# Patient Record
Sex: Male | Born: 1965 | Race: White | Hispanic: No | Marital: Single | Smoking: Current some day smoker
Health system: Southern US, Community
[De-identification: ages and names within clinical notes are randomized; demographics above are authoritative.]

## PROBLEM LIST (undated history)

## (undated) HISTORY — PX: APPENDECTOMY: SHX54

---

## 2002-09-01 ENCOUNTER — Observation Stay (HOSPITAL_COMMUNITY): Admission: EM | Admit: 2002-09-01 | Discharge: 2002-09-02 | Payer: Self-pay

## 2002-09-01 ENCOUNTER — Encounter: Payer: Self-pay | Admitting: Surgery

## 2011-03-17 ENCOUNTER — Other Ambulatory Visit: Payer: Self-pay

## 2011-03-17 ENCOUNTER — Emergency Department (HOSPITAL_COMMUNITY)
Admission: EM | Admit: 2011-03-17 | Discharge: 2011-03-17 | Disposition: A | Discharge status: Home / Self Care | Payer: 59 | Attending: Emergency Medicine | Admitting: Emergency Medicine

## 2011-03-17 ENCOUNTER — Emergency Department (HOSPITAL_COMMUNITY): Payer: 59

## 2011-03-17 DIAGNOSIS — R42 Dizziness and giddiness: Secondary | ICD-10-CM | POA: Insufficient documentation

## 2011-03-17 DIAGNOSIS — K625 Hemorrhage of anus and rectum: Secondary | ICD-10-CM | POA: Insufficient documentation

## 2011-03-17 DIAGNOSIS — K5730 Diverticulosis of large intestine without perforation or abscess without bleeding: Secondary | ICD-10-CM | POA: Insufficient documentation

## 2011-03-17 DIAGNOSIS — K5790 Diverticulosis of intestine, part unspecified, without perforation or abscess without bleeding: Secondary | ICD-10-CM

## 2011-03-17 LAB — GLUCOSE, CAPILLARY

## 2011-03-17 LAB — COMPREHENSIVE METABOLIC PANEL
ALT: 23 U/L (ref 0–53)
AST: 17 U/L (ref 0–37)
CO2: 21 mEq/L (ref 19–32)
Calcium: 9.6 mg/dL (ref 8.4–10.5)
Chloride: 96 mEq/L (ref 96–112)
GFR calc non Af Amer: >90 mL/min (ref >90)
Sodium: 133 mEq/L — ABNORMAL LOW (ref 135–145)
Total Bilirubin: 0.3 mg/dL (ref 0.3–1.2)

## 2011-03-17 LAB — CBC
Platelets: 270 10*3/uL (ref 150–400)
RBC: 5.08 MIL/uL (ref 4.22–5.81)
WBC: 14.7 10*3/uL — ABNORMAL HIGH (ref 4.0–10.5)

## 2011-03-17 LAB — TYPE AND SCREEN

## 2011-03-17 LAB — PROTIME-INR: Prothrombin Time: 12.6 seconds (ref 11.6–15.2)

## 2011-03-17 MED ORDER — IOHEXOL 300 MG/ML  SOLN
100.0000 mL | Freq: Once | INTRAMUSCULAR | Status: AC | PRN
  Administered 2011-03-17: 100 mL via INTRAVENOUS

## 2011-03-17 MED ORDER — SODIUM CHLORIDE 0.9 % IV SOLN
80.0000 mg | Freq: Once | INTRAVENOUS | Status: AC
  Administered 2011-03-17: 80 mg via INTRAVENOUS
  Filled 2011-03-17: qty 80

## 2011-03-17 MED ORDER — SODIUM CHLORIDE 0.9 % IV SOLN
8.0000 mg/h | INTRAVENOUS | Status: DC
  Administered 2011-03-17: 8 mg/h via INTRAVENOUS
  Filled 2011-03-17 (×2): qty 80

## 2011-03-17 MED ORDER — SODIUM CHLORIDE 0.9 % IV BOLUS (SEPSIS)
1000.0000 mL | Freq: Once | INTRAVENOUS | Status: AC
  Administered 2011-03-17: 1000 mL via INTRAVENOUS

## 2011-03-17 NOTE — ED Notes (Signed)
Pt complains of bright red blood in his stool with clots three times  Since last night, hes sweaty and very nauseated, he states that his vision is blurry in triage

## 2011-03-17 NOTE — ED Provider Notes (Signed)
History     CSN: [id number]  Arrival date & time 03/17/11  1231   First MD Initiated Contact with Patient 03/17/11 1247      Chief Complaint  Patient presents with  . Excessive Sweating  . Nausea  . Rectal Bleeding    (Consider location/radiation/quality/duration/timing/severity/associated sxs/prior treatment) HPI Patient is a 45 roll male who presents today for rectal bleeding. The patient has had 3 episodes of soft stools with the toilet filled with blood. He has had no abdominal pain with this. He did get lightheaded but denies any chest pain or shortness of breath with this. Patient has not had any recent abdominal pain. He was mildly tachycardic on presentation. Patient has no other health problems. He denies any fevers, nausea, or vomiting. There are no other associated or modifying factors. No past medical history on file.  No past surgical history on file.  No family history on file.  History  Substance Use Topics  . Smoking status: Not on file  . Smokeless tobacco: Not on file  . Alcohol Use: Not on file      Review of Systems  Constitutional: Negative.   HENT: Negative.   Eyes: Negative.   Respiratory: Negative.   Cardiovascular: Negative.   Gastrointestinal: Positive for blood in stool.  Genitourinary: Negative.   Musculoskeletal: Negative.   Skin: Negative.   Neurological: Positive for light-headedness.  Hematological: Negative.   Psychiatric/Behavioral: Negative.   All other systems reviewed and are negative.    Allergies  Review of patient's allergies indicates no known allergies.  Home Medications   Current Outpatient Rx  Name Route Sig Dispense Refill  . ASPIRIN EC 81 MG PO TBEC Oral Take 81 mg by mouth daily.        BP 135/83  Pulse 71  Temp(Src) 97.6 F (36.4 C) (Oral)  Resp 20  SpO2 100%  Physical Exam  Nursing note and vitals reviewed. Constitutional: He is oriented to person, place, and time. He appears well-developed and  well-nourished. No distress.  HENT:  Head: Normocephalic and atraumatic.  Eyes: Conjunctivae and EOM are normal. Pupils are equal, round, and reactive to light.  Neck: Normal range of motion.  Cardiovascular: Normal rate, regular rhythm, normal heart sounds and intact distal pulses.  Exam reveals no gallop and no friction rub.   No murmur heard. Pulmonary/Chest: Effort normal and breath sounds normal. No respiratory distress. He has no wheezes. He has no rales.  Abdominal: Soft. Bowel sounds are normal. He exhibits no distension. There is no tenderness. There is no rebound and no guarding.  Genitourinary: Guaiac positive stool.       Good rectal tone, gross blood on the glove.  Musculoskeletal: Normal range of motion.  Neurological: He is alert and oriented to person, place, and time. No cranial nerve deficit. He exhibits normal muscle tone. Coordination normal.  Skin: Skin is warm and dry. No rash noted.  Psychiatric: He has a normal mood and affect.    ED Course  Procedures (including critical care time)  Labs Reviewed  CBC - Abnormal; Notable for the following:    WBC 14.7 (*)    MCHC 36.5 (*)    All other components within normal limits  COMPREHENSIVE METABOLIC PANEL - Abnormal; Notable for the following:    Sodium 133 (*)    Glucose, Bld 134 (*)    All other components within normal limits  GLUCOSE, CAPILLARY - Abnormal; Notable for the following:    Glucose-Capillary 134 (*)  All other components within normal limits  PROTIME-INR  TYPE AND SCREEN  LIPASE, BLOOD  OCCULT BLOOD, POC DEVICE  ABO/RH  POCT OCCULT BLOOD STOOL, DEVICE   Ct Abdomen Pelvis W Contrast  03/17/2011  *RADIOLOGY REPORT*  Clinical Data: 46 year old with rectal bleeding and lightheadedness.  CT ABDOMEN AND PELVIS WITH CONTRAST  Technique:  Multidetector CT imaging of the abdomen and pelvis was performed following the standard protocol during bolus administration of intravenous contrast.  Contrast:  OMNIPAQUE IOHEXOL 300 MG/ML IV SOLN  Comparison: CT report from 09/01/2002.  These images are not available.  Findings: Lung bases are clear.  There is no evidence for free air. Normal appearance of the liver, portal venous system, gallbladder, spleen, stomach and pancreas.  Normal appearance of the adrenal tissue and kidneys.  There is fluid within the urinary bladder without gross abnormality.  No gross abnormality prostate or seminal vesicles.  Bilateral inguinal hernias containing fat. There is a small cyst in the posterior right kidney.  There is oral contrast in the small and large bowel.  There are left-sided colonic diverticula without acute inflammation. There are small lymph nodes in the retroperitoneum around the aorta. Index lymph node between the aorta and inferior vena cava on sequence 2, image 36 measures 1.1 x 0.9 cm.  Lymph node in the left external iliac area roughly measures 0.9 cm in the short axis.  No focal rectal or colonic abnormality.  No acute bony abnormalities.  IMPRESSION: No acute abdominal or pelvic abnormalities.  Colonic diverticulosis without focal inflammation.  Small retroperitoneal lymph nodes which are nonspecific.  Original Report Authenticated By: Richarda Overlie, M.D.     1. Diverticulosis   2. Rectal bleeding       MDM  Patient was evaluated and workup for possible GI bleed was begun. Patient did have rectal exam with gross blood on the glove.  Patient was feeling better by the time he arrived. He was no longer tachycardic on my evaluation though he had been tachycardic at triage. Patient had no abdominal pain. As a precaution he was kept n.p.o. and was started on protonix drip. Hemoglobin was stable. Patient had no change in his vital signs.  Patient did have a CT of the abdomen and pelvis. This showed diverticulosis. I discussed these findings with the patient. I spoke with Dr. Matthias Hughs of gastroenterology. He arranged for the patient to have an appointment tomorrow  with his partner Dr. Evette Cristal.  Patient will be seen tomorrow at 9:45 AM. If he has change in his symptoms including chest pain, turn of lightheadedness, shortness of breath, or abdominal pain he is welcome to return for repeat evaluation. Patient and family were comfortable with plan for discharge home.        Cyndra Numbers, MD 03/17/11 1746

## 2011-03-20 ENCOUNTER — Other Ambulatory Visit: Payer: Self-pay | Admitting: Gastroenterology

## 2012-09-14 IMAGING — CT CT ABD-PELV W/ CM
1 of 3 series · 14 of 32 positions shown, 19 images · IV contrast (APPLIED)
Comparison: CT report from 09/01/2002.  These images are not
available.

CLINICAL DATA: 45-year-old with rectal bleeding and
lightheadedness.

CT ABDOMEN AND PELVIS WITH CONTRAST
TECHNIQUE: Multidetector CT imaging of the abdomen and pelvis was
performed following the standard protocol during bolus
administration of intravenous contrast.
Contrast: 100mL OMNIPAQUE IOHEXOL 300 MG/ML IV SOLN

[Series 2: abd/pel with · axial · 0.97mm/px · z∈[+1608,+2048]mm · 14 of 100 slices shown, 19 images]
[im 6/100  soft-tissue]
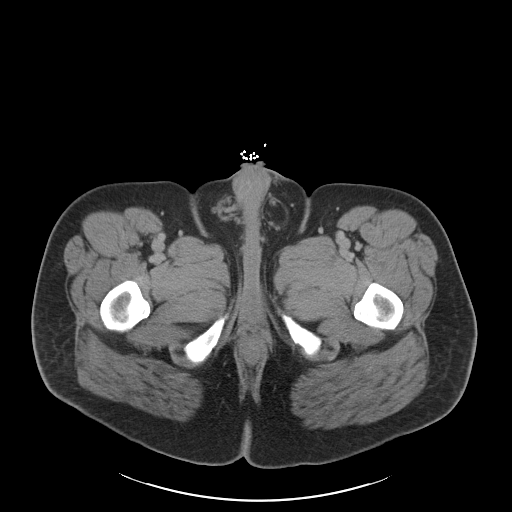
[im 6/100  bone]
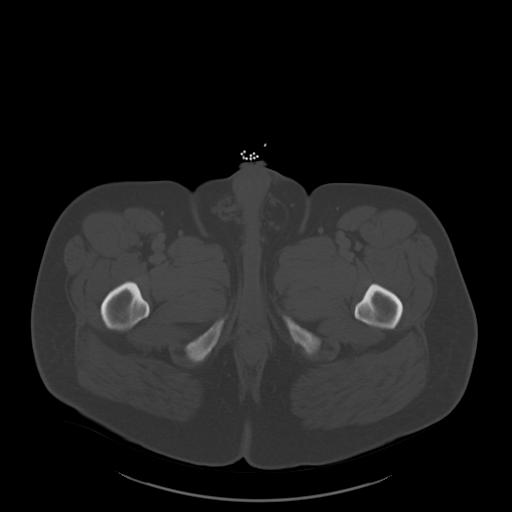
[im 12/100  soft-tissue]
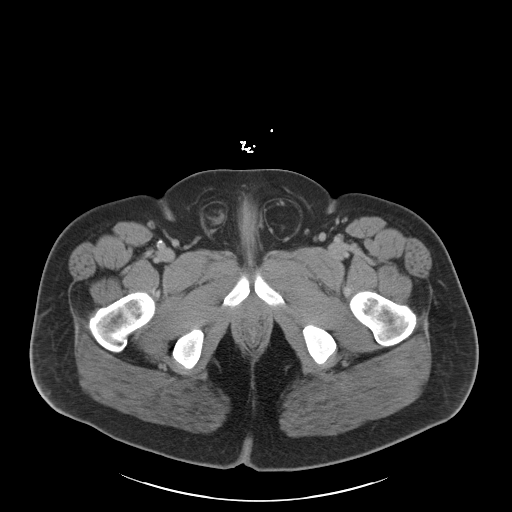
[im 23/100  soft-tissue]
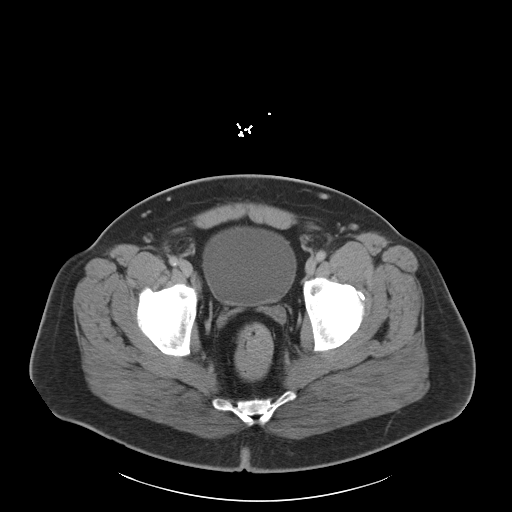
[im 28/100  soft-tissue]
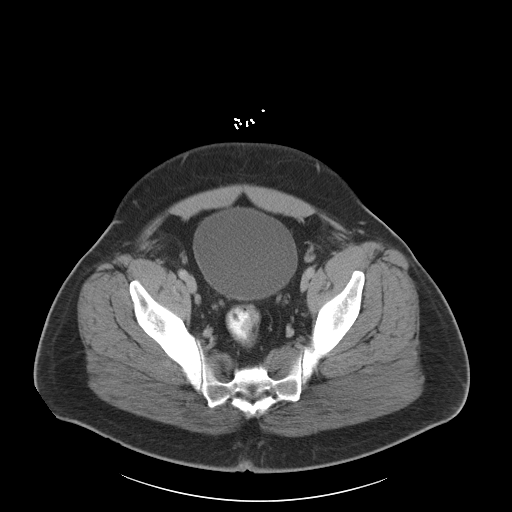
[im 34/100  soft-tissue]
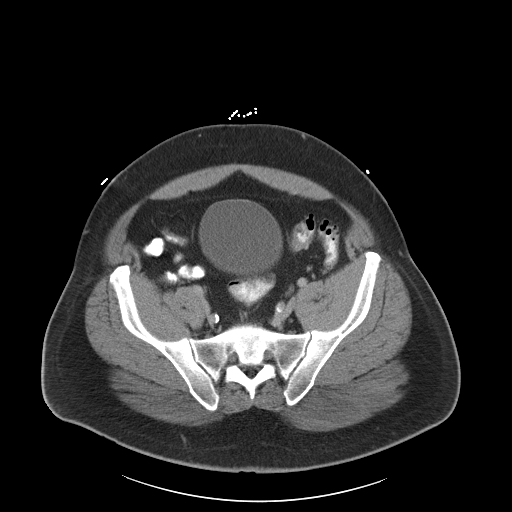
[im 45/100  soft-tissue]
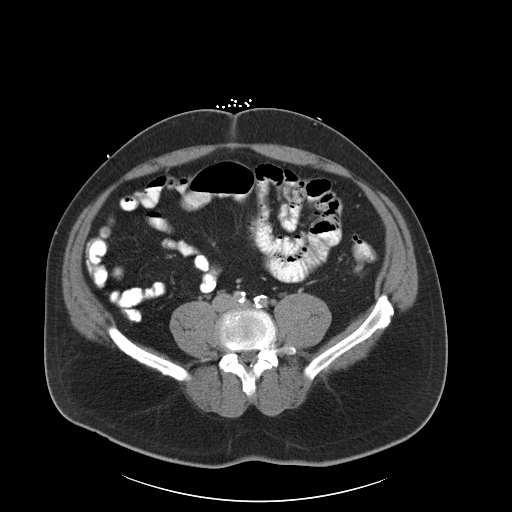
[im 50/100  soft-tissue]
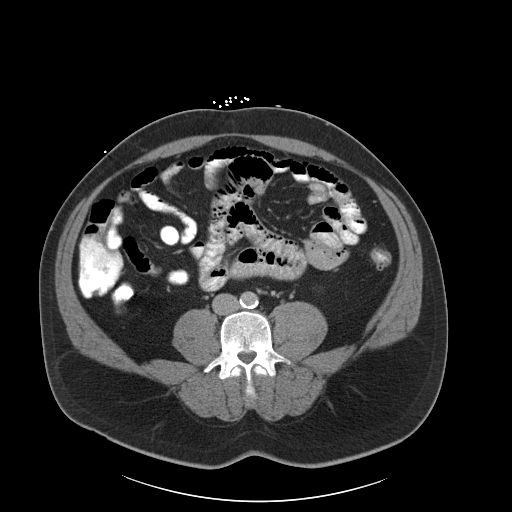
[im 56/100  soft-tissue]
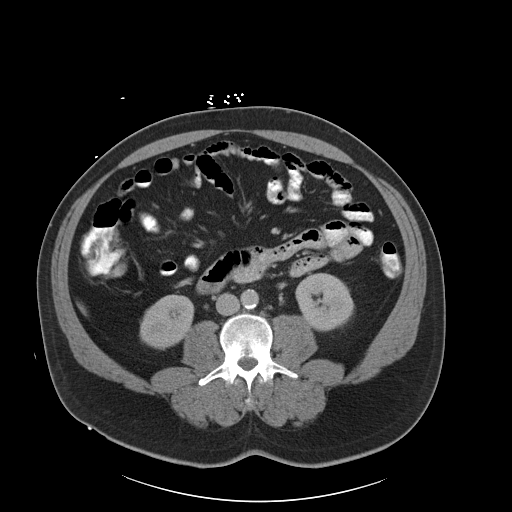
[im 67/100  soft-tissue]
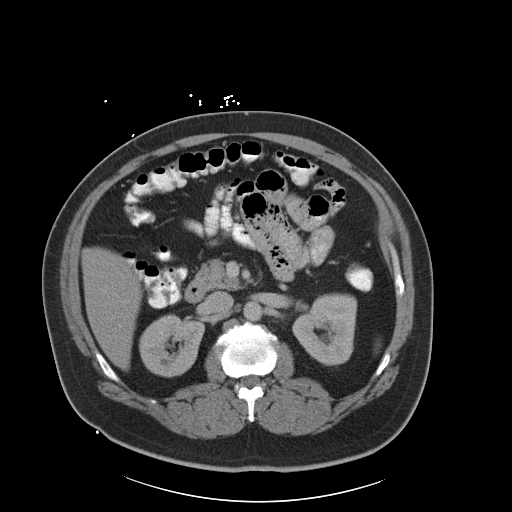
[im 67/100  bone]
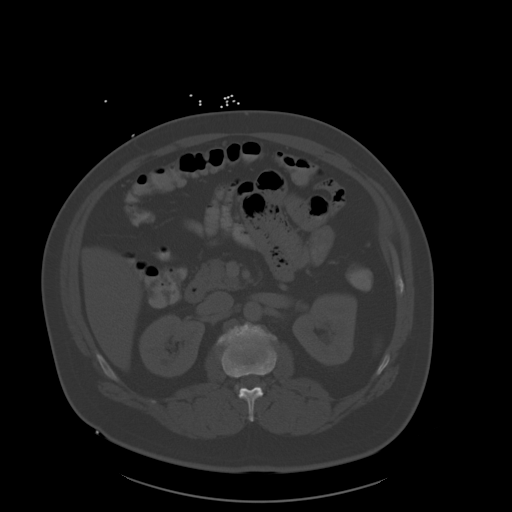
[im 72/100  soft-tissue]
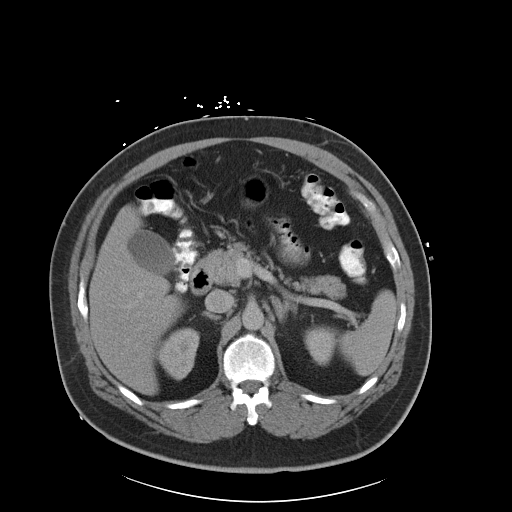
[im 78/100  soft-tissue]
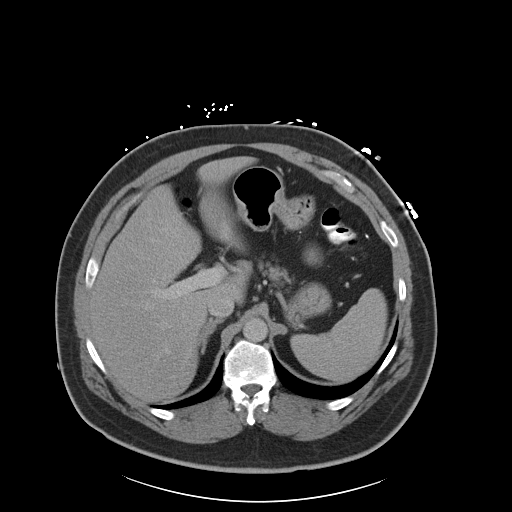
[im 78/100  lung]
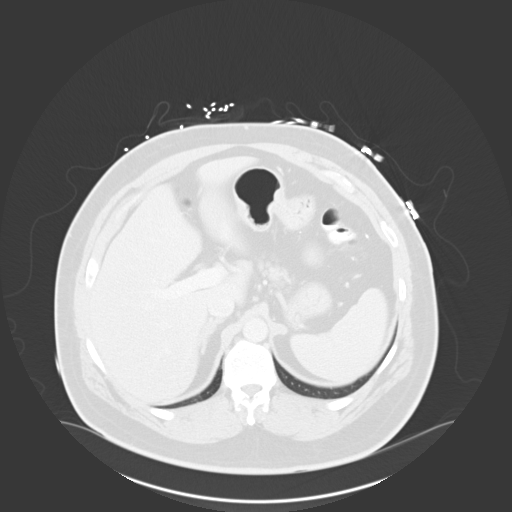
[im 83/100  lung]
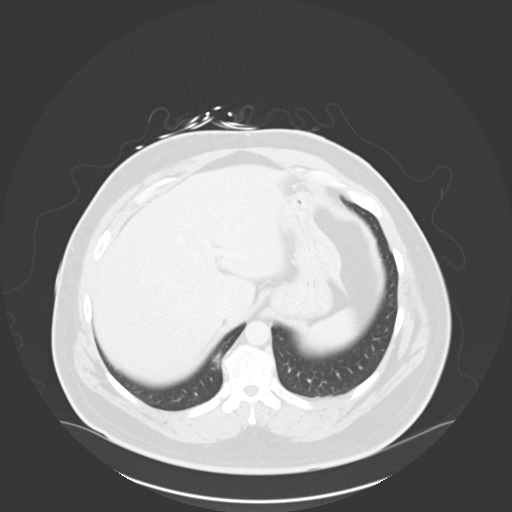
[im 89/100  soft-tissue]
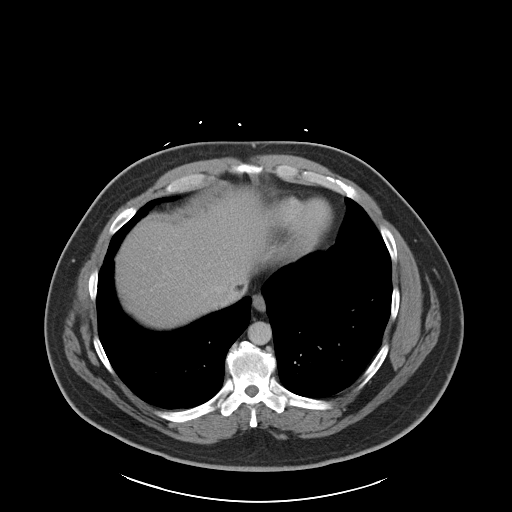
[im 89/100  lung]
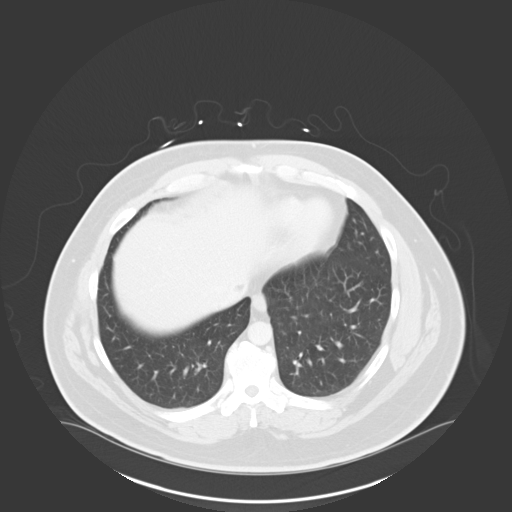
[im 94/100  soft-tissue]
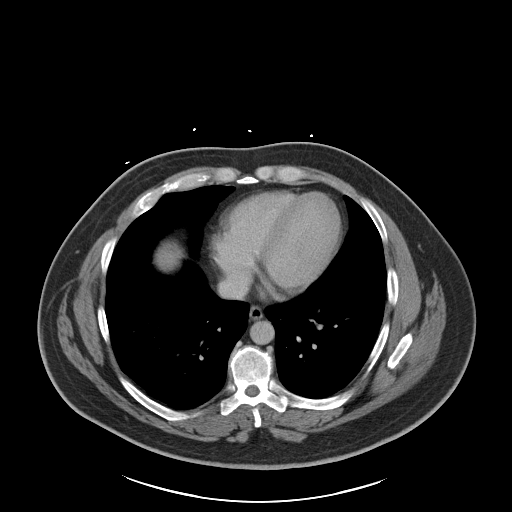
[im 94/100  lung]
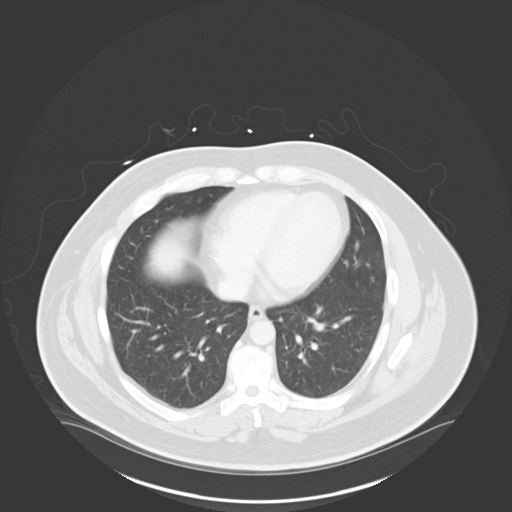

[14 of 32 positions shown; findings below may reference images not displayed]

FINDINGS: Lung bases are clear.  There is no evidence for free air.
Normal appearance of the liver, portal venous system, gallbladder,
spleen, stomach and pancreas.  Normal appearance of the adrenal
tissue and kidneys.  There is fluid within the urinary bladder
without gross abnormality.  No gross abnormality prostate or
seminal vesicles.  Bilateral inguinal hernias containing fat. There
is a small cyst in the posterior right kidney.

There is oral contrast in the small and large bowel.  There are
left-sided colonic diverticula without acute inflammation. There
are small lymph nodes in the retroperitoneum around the aorta.
Index lymph node between the aorta and inferior vena cava on
sequence 2, image 36 measures 1.1 x 0.9 cm.  Lymph node in the left
external iliac area roughly measures 0.9 cm in the short axis.  No
focal rectal or colonic abnormality.  No acute bony abnormalities.
IMPRESSION: No acute abdominal or pelvic abnormalities.

Colonic diverticulosis without focal inflammation.

Small retroperitoneal lymph nodes which are nonspecific.

## 2019-07-25 ENCOUNTER — Other Ambulatory Visit: Payer: Self-pay

## 2019-07-25 ENCOUNTER — Emergency Department (HOSPITAL_COMMUNITY): Payer: No Typology Code available for payment source

## 2019-07-25 ENCOUNTER — Emergency Department (HOSPITAL_COMMUNITY): Payer: No Typology Code available for payment source | Admitting: Anesthesiology

## 2019-07-25 ENCOUNTER — Encounter (HOSPITAL_COMMUNITY)
Admission: EM | Disposition: A | Discharge status: Home / Self Care | Payer: Self-pay | Source: Home / Self Care | Attending: Emergency Medicine

## 2019-07-25 ENCOUNTER — Ambulatory Visit (HOSPITAL_COMMUNITY)
Admission: EM | Admit: 2019-07-25 | Discharge: 2019-07-25 | Disposition: A | Discharge status: Home / Self Care | Payer: No Typology Code available for payment source | Attending: Emergency Medicine | Admitting: Emergency Medicine

## 2019-07-25 ENCOUNTER — Encounter (HOSPITAL_COMMUNITY): Payer: Self-pay | Admitting: Emergency Medicine

## 2019-07-25 DIAGNOSIS — W230XXA Caught, crushed, jammed, or pinched between moving objects, initial encounter: Secondary | ICD-10-CM | POA: Diagnosis not present

## 2019-07-25 DIAGNOSIS — S6991XA Unspecified injury of right wrist, hand and finger(s), initial encounter: Secondary | ICD-10-CM | POA: Diagnosis present

## 2019-07-25 DIAGNOSIS — Z20822 Contact with and (suspected) exposure to covid-19: Secondary | ICD-10-CM | POA: Diagnosis not present

## 2019-07-25 DIAGNOSIS — F17200 Nicotine dependence, unspecified, uncomplicated: Secondary | ICD-10-CM | POA: Insufficient documentation

## 2019-07-25 DIAGNOSIS — S62636B Displaced fracture of distal phalanx of right little finger, initial encounter for open fracture: Secondary | ICD-10-CM | POA: Diagnosis not present

## 2019-07-25 DIAGNOSIS — S62639B Displaced fracture of distal phalanx of unspecified finger, initial encounter for open fracture: Secondary | ICD-10-CM | POA: Diagnosis present

## 2019-07-25 HISTORY — PX: I & D EXTREMITY: SHX5045

## 2019-07-25 LAB — SARS CORONAVIRUS 2 BY RT PCR (HOSPITAL ORDER, PERFORMED IN ~~LOC~~ HOSPITAL LAB): SARS Coronavirus 2: NEGATIVE

## 2019-07-25 SURGERY — IRRIGATION AND DEBRIDEMENT EXTREMITY
Anesthesia: General | Laterality: Right

## 2019-07-25 MED ORDER — FENTANYL CITRATE (PF) 100 MCG/2ML IJ SOLN: 25.0000 ug | INTRAMUSCULAR | Status: DC | PRN

## 2019-07-25 MED ORDER — PROPOFOL 10 MG/ML IV BOLUS
INTRAVENOUS | Status: DC | PRN
  Administered 2019-07-25: 200 mg via INTRAVENOUS

## 2019-07-25 MED ORDER — DEXAMETHASONE SODIUM PHOSPHATE 10 MG/ML IJ SOLN
INTRAMUSCULAR | Status: DC | PRN
  Administered 2019-07-25: 10 mg via INTRAVENOUS

## 2019-07-25 MED ORDER — HYDRALAZINE HCL 20 MG/ML IJ SOLN: 10.0000 mg | Freq: Once | INTRAMUSCULAR | Status: AC | PRN

## 2019-07-25 MED ORDER — DEXAMETHASONE SODIUM PHOSPHATE 10 MG/ML IJ SOLN
INTRAMUSCULAR | Status: AC
  Filled 2019-07-25: qty 1

## 2019-07-25 MED ORDER — TRAMADOL HCL 50 MG PO TABS: 100.0000 mg | ORAL_TABLET | Freq: Four times a day (QID) | ORAL | 0 refills | Status: AC | PRN

## 2019-07-25 MED ORDER — LABETALOL HCL 5 MG/ML IV SOLN
INTRAVENOUS | Status: AC
  Filled 2019-07-25: qty 4

## 2019-07-25 MED ORDER — ONDANSETRON HCL 4 MG/2ML IJ SOLN: 4.0000 mg | Freq: Once | INTRAMUSCULAR | Status: DC | PRN

## 2019-07-25 MED ORDER — MIDAZOLAM HCL 2 MG/2ML IJ SOLN
INTRAMUSCULAR | Status: AC
  Filled 2019-07-25: qty 2

## 2019-07-25 MED ORDER — OXYCODONE HCL 5 MG/5ML PO SOLN: 5.0000 mg | Freq: Once | ORAL | Status: DC | PRN

## 2019-07-25 MED ORDER — 0.9 % SODIUM CHLORIDE (POUR BTL) OPTIME
TOPICAL | Status: DC | PRN
  Administered 2019-07-25: 1000 mL

## 2019-07-25 MED ORDER — BUPIVACAINE HCL (PF) 0.5 % IJ SOLN
INTRAMUSCULAR | Status: AC
  Filled 2019-07-25: qty 30

## 2019-07-25 MED ORDER — CHLORHEXIDINE GLUCONATE 4 % EX LIQD: 60.0000 mL | Freq: Once | CUTANEOUS | Status: DC

## 2019-07-25 MED ORDER — CEFAZOLIN SODIUM-DEXTROSE 2-4 GM/100ML-% IV SOLN
2.0000 g | Freq: Once | INTRAVENOUS | Status: AC
  Administered 2019-07-25: 2 g via INTRAVENOUS
  Filled 2019-07-25 (×2): qty 100

## 2019-07-25 MED ORDER — ACETAMINOPHEN 10 MG/ML IV SOLN: 1000.0000 mg | Freq: Once | INTRAVENOUS | Status: DC | PRN

## 2019-07-25 MED ORDER — ONDANSETRON HCL 4 MG/2ML IJ SOLN
INTRAMUSCULAR | Status: AC
  Filled 2019-07-25: qty 2

## 2019-07-25 MED ORDER — NICOTINE 21 MG/24HR TD PT24
21.0000 mg | MEDICATED_PATCH | Freq: Once | TRANSDERMAL | Status: DC
  Administered 2019-07-25: 21 mg via TRANSDERMAL
  Filled 2019-07-25: qty 1

## 2019-07-25 MED ORDER — PROPOFOL 10 MG/ML IV BOLUS
INTRAVENOUS | Status: AC
  Filled 2019-07-25: qty 20

## 2019-07-25 MED ORDER — BUPIVACAINE HCL (PF) 0.5 % IJ SOLN
10.0000 mL | Freq: Once | INTRAMUSCULAR | Status: AC
  Administered 2019-07-25: 10 mL
  Filled 2019-07-25: qty 30

## 2019-07-25 MED ORDER — FENTANYL CITRATE (PF) 100 MCG/2ML IJ SOLN
INTRAMUSCULAR | Status: DC | PRN
  Administered 2019-07-25: 50 ug via INTRAVENOUS
  Administered 2019-07-25: 100 ug via INTRAVENOUS

## 2019-07-25 MED ORDER — MIDAZOLAM HCL 5 MG/5ML IJ SOLN
INTRAMUSCULAR | Status: DC | PRN
  Administered 2019-07-25: 2 mg via INTRAVENOUS

## 2019-07-25 MED ORDER — LIDOCAINE HCL (CARDIAC) PF 50 MG/5ML IV SOSY
PREFILLED_SYRINGE | INTRAVENOUS | Status: DC | PRN
  Administered 2019-07-25: 80 mg via INTRAVENOUS

## 2019-07-25 MED ORDER — HYDRALAZINE HCL 20 MG/ML IJ SOLN
INTRAMUSCULAR | Status: AC
  Administered 2019-07-25: 10 mg via INTRAVENOUS
  Filled 2019-07-25: qty 1

## 2019-07-25 MED ORDER — FENTANYL CITRATE (PF) 100 MCG/2ML IJ SOLN
INTRAMUSCULAR | Status: AC
  Filled 2019-07-25: qty 2

## 2019-07-25 MED ORDER — ONDANSETRON HCL 4 MG/2ML IJ SOLN
INTRAMUSCULAR | Status: DC | PRN
  Administered 2019-07-25: 4 mg via INTRAVENOUS

## 2019-07-25 MED ORDER — BUPIVACAINE HCL (PF) 0.5 % IJ SOLN
INTRAMUSCULAR | Status: DC | PRN
  Administered 2019-07-25: 10 mL

## 2019-07-25 MED ORDER — MEPERIDINE HCL 50 MG/ML IJ SOLN: 6.2500 mg | INTRAMUSCULAR | Status: DC | PRN

## 2019-07-25 MED ORDER — CEFAZOLIN SODIUM-DEXTROSE 2-4 GM/100ML-% IV SOLN: 2.0000 g | INTRAVENOUS | Status: DC

## 2019-07-25 MED ORDER — CEPHALEXIN 500 MG PO CAPS: 500.0000 mg | ORAL_CAPSULE | Freq: Four times a day (QID) | ORAL | 0 refills | Status: AC

## 2019-07-25 MED ORDER — TETANUS-DIPHTH-ACELL PERTUSSIS 5-2.5-18.5 LF-MCG/0.5 IM SUSP
0.5000 mL | Freq: Once | INTRAMUSCULAR | Status: AC
  Administered 2019-07-25: 0.5 mL via INTRAMUSCULAR
  Filled 2019-07-25: qty 0.5

## 2019-07-25 MED ORDER — LIDOCAINE 2% (20 MG/ML) 5 ML SYRINGE
INTRAMUSCULAR | Status: AC
  Filled 2019-07-25: qty 5

## 2019-07-25 MED ORDER — LACTATED RINGERS IV SOLN: INTRAVENOUS | Status: DC

## 2019-07-25 MED ORDER — LABETALOL HCL 5 MG/ML IV SOLN
INTRAVENOUS | Status: DC | PRN
  Administered 2019-07-25 (×3): 5 mg via INTRAVENOUS

## 2019-07-25 MED ORDER — OXYCODONE HCL 5 MG PO TABS: 5.0000 mg | ORAL_TABLET | Freq: Once | ORAL | Status: DC | PRN

## 2019-07-25 MED ORDER — LACTATED RINGERS IV SOLN: INTRAVENOUS | Status: DC | PRN

## 2019-07-25 MED ORDER — POVIDONE-IODINE 10 % EX SWAB
2.0000 "application " | Freq: Once | CUTANEOUS | Status: AC
  Administered 2019-07-25: 2 via TOPICAL

## 2019-07-25 SURGICAL SUPPLY — 37 items
BAG ZIPLOCK 12X15 (MISCELLANEOUS) ×2 IMPLANT
BNDG COHESIVE 1X5 TAN STRL LF (GAUZE/BANDAGES/DRESSINGS) ×2 IMPLANT
BNDG GAUZE ELAST 4 BULKY (GAUZE/BANDAGES/DRESSINGS) ×2 IMPLANT
CORD BIPOLAR FORCEPS 12FT (ELECTRODE) ×2 IMPLANT
COVER SURGICAL LIGHT HANDLE (MISCELLANEOUS) ×2 IMPLANT
COVER WAND RF STERILE (DRAPES) IMPLANT
CUFF TOURN SGL QUICK 18X4 (TOURNIQUET CUFF) ×2 IMPLANT
CUFF TOURN SGL QUICK 24 (TOURNIQUET CUFF)
CUFF TRNQT CYL 24X4X16.5-23 (TOURNIQUET CUFF) IMPLANT
DRAIN PENROSE 0.5X18 (DRAIN) IMPLANT
DRAPE SURG 17X11 SM STRL (DRAPES) ×4 IMPLANT
ELECT REM PT RETURN 15FT ADLT (MISCELLANEOUS) ×2 IMPLANT
GAUZE SPONGE 4X4 12PLY STRL (GAUZE/BANDAGES/DRESSINGS) ×2 IMPLANT
GAUZE XEROFORM 5X9 LF (GAUZE/BANDAGES/DRESSINGS) ×2 IMPLANT
GLOVE BIOGEL M 8.0 STRL (GLOVE) ×4 IMPLANT
GLOVE BIOGEL PI IND STRL 6.5 (GLOVE) ×1 IMPLANT
GLOVE BIOGEL PI IND STRL 7.0 (GLOVE) ×1 IMPLANT
GLOVE BIOGEL PI IND STRL 7.5 (GLOVE) ×1 IMPLANT
GLOVE BIOGEL PI INDICATOR 6.5 (GLOVE) ×1
GLOVE BIOGEL PI INDICATOR 7.0 (GLOVE) ×1
GLOVE BIOGEL PI INDICATOR 7.5 (GLOVE) ×1
HANDPIECE INTERPULSE COAX TIP (DISPOSABLE) ×2
KIT BASIN (CUSTOM PROCEDURE TRAY) ×2 IMPLANT
KIT TURNOVER KIT A (KITS) IMPLANT
NEEDLE HYPO 22GX1.5 SAFETY (NEEDLE) ×2 IMPLANT
NS IRRIG 1000ML POUR BTL (IV SOLUTION) ×2 IMPLANT
PACK ORTHO EXTREMITY (CUSTOM PROCEDURE TRAY) ×2 IMPLANT
PENCIL SMOKE EVACUATOR (MISCELLANEOUS) IMPLANT
PROTECTOR NERVE ULNAR (MISCELLANEOUS) ×2 IMPLANT
SET HNDPC FAN SPRY TIP SCT (DISPOSABLE) ×1 IMPLANT
STOCKINETTE 6  STRL (DRAPES) ×2
STOCKINETTE 6 STRL (DRAPES) ×1 IMPLANT
SUT ETHILON 3 0 PS 1 (SUTURE) ×4 IMPLANT
SUT ETHILON 4 0 PS 2 18 (SUTURE) ×4 IMPLANT
SWAB COLLECTION DEVICE MRSA (MISCELLANEOUS) IMPLANT
SWAB CULTURE ESWAB REG 1ML (MISCELLANEOUS) IMPLANT
SYR CONTROL 10ML LL (SYRINGE) ×2 IMPLANT

## 2019-07-25 NOTE — ED Provider Notes (Signed)
[facility] DEPT Provider Note   CSN: [id number] Arrival date & time: 07/25/19  1004     History Chief Complaint  Patient presents with  . Finger Injury    Micheal Nguyen is a 54 y.o. male presenting to the emergency department with sudden onset of right fifth digit injury that occurred prior to arrival.  Patient states he was at work pulling a pipe when his right fifth finger got caught under a piece of equipment and was wrenched.  He has nitrogen deformity to his distal finger as well as reported decrease sensation.  Last tetanus immunization was over 10 years ago.  Denies history of immunocompromise or diabetes.  No interventions tried prior to arrival for symptoms.  The history is provided by the patient.       History reviewed. No pertinent past medical history.  There are no problems to display for this patient.   History reviewed. No pertinent surgical history.     No family history on file.  Social History   Tobacco Use  . Smoking status: Not on file  Substance Use Topics  . Alcohol use: Not on file  . Drug use: Not on file    Home Medications Prior to Admission medications   Medication Sig Start Date End Date Taking? Authorizing Provider  aspirin EC 81 MG tablet Take 81 mg by mouth daily.      [provider]    Allergies    Patient has no known allergies.  Review of Systems   Review of Systems  All other systems reviewed and are negative.   Physical Exam Updated Vital Signs BP (!) 161/98 (BP Location: Left Arm)   Pulse 86   Temp 98.1 F (36.7 C) (Oral)   Resp 18   SpO2 96%   Physical Exam Vitals and nursing note reviewed.  Constitutional:      General: He is not in acute distress.    Appearance: He is well-developed.  HENT:     Head: Normocephalic and atraumatic.  Eyes:     Conjunctiva/sclera: Conjunctivae normal.  Cardiovascular:     Rate and Rhythm: Normal rate.  Pulmonary:     Effort: Pulmonary  effort is normal.  Abdominal:     Palpations: Abdomen is soft.  Musculoskeletal:     Comments: Right distal phalanx with deformity and near amputation of the distal finger.  The nail matrix is exposed as well though the finger nail is in hold.  Wound is not actively bleeding.  There is decreased sensation to the distal ulnar aspect and normal sensation to the radial aspect of the finger tip.  The fingertip appears dusky in color.  Skin:    General: Skin is warm.  Neurological:     Mental Status: He is alert.  Psychiatric:        Behavior: Behavior normal.         ED Results / Procedures / Treatments   Labs (all labs ordered are listed, but only abnormal results are displayed) Labs Reviewed  SARS CORONAVIRUS 2 BY RT PCR (HOSPITAL ORDER, [facility])    EKG None  Radiology DG Finger Little Right  Result Date: 07/25/2019 CLINICAL DATA:  Crush injury EXAM: RIGHT LITTLE FINGER 2+V COMPARISON:  None. FINDINGS: Comminuted fracture of the tuft of the distal fifth phalanx with mild displacement. Overlying soft tissue injury. 2 mm bony density overlying the PIP joint likely chronic injury. IMPRESSION: Comminuted fracture distal first phalanx with  overlying soft tissue injury. Electronically Signed   By: Marlan Palau M.D.   On: 07/25/2019 10:54    Procedures .Nerve Block  Date/Time: 07/25/2019 1:24 PM Performed by: Severo Beber, [place] N, PA-C Authorized by: Delane Wessinger, [place] N, PA-C   Consent:    Consent obtained:  Verbal   Consent given by:  Patient   Risks discussed:  Unsuccessful block and pain   Alternatives discussed:  Alternative treatment Indications:    Indications:  Pain relief Location:    Body area:  Upper extremity (Right fifth digit)   Laterality:  Right Pre-procedure details:    Skin preparation:  Alcohol Procedure details (see MAR for exact dosages):    Block needle gauge:  25 G   Anesthetic injected:  Bupivacaine 0.5% w/o epi    Steroid injected:  None   Injection procedure:  Anatomic landmarks palpated Post-procedure details:    Outcome:  Anesthesia achieved   Patient tolerance of procedure:  Tolerated well, no immediate complications   (including critical care time)  Medications Ordered in ED Medications  ceFAZolin (ANCEF) IVPB 2g/100 mL premix (has no administration in time range)  nicotine (NICODERM CQ - dosed in mg/24 hours) patch 21 mg (has no administration in time range)  Tdap (BOOSTRIX) injection 0.5 mL (0.5 mLs Intramuscular Given 07/25/19 1224)  bupivacaine (MARCAINE) 0.5 % injection 10 mL (10 mLs Infiltration Given by Other 07/25/19 1224)    ED Course  I have reviewed the triage vital signs and the nursing notes.  Pertinent labs & imaging results that were available during my care of the patient were reviewed by me and considered in my medical decision making (see chart for details).    MDM Rules/Calculators/A&P                      Patient with near amputation of the right distal fifth phalanx as well as injury to the fingernail; comminuted fracture of the distal phalanx.  Decreased sensation to the ulnar aspect of the fingertip.  No history of immunocompromise.  Consulted with orthopedics, PA Earney Hamburg evaluated patient at bedside.  Recommends OR management for washout and repair by Dr. Izora Ribas.  Patient administered digital block for pain control, and given Ancef and Tdap.  Final Clinical Impression(s) / ED Diagnoses Final diagnoses:  Open fracture of tuft of distal phalanx of finger  Injury to fingernail of right hand, initial encounter    Rx / DC Orders ED Discharge Orders    None       Yossi Hinchman, [place] N, PA-C 07/25/19 1453    Arby Barrette, MD 07/26/19 [phone]

## 2019-07-25 NOTE — Anesthesia Procedure Notes (Signed)
Procedure Name: LMA Insertion Date/Time: 07/25/2019 6:53 PM Performed by: Illene Silver, CRNA Pre-anesthesia Checklist: Patient identified, Emergency Drugs available, Suction available and Patient being monitored Patient Re-evaluated:Patient Re-evaluated prior to induction Oxygen Delivery Method: Circle system utilized Preoxygenation: Pre-oxygenation with 100% oxygen Induction Type: IV induction LMA: LMA with gastric port inserted LMA Size: 5.0 Tube type: Oral Number of attempts: 1 Airway Equipment and Method: Oral airway Placement Confirmation: positive ETCO2 Tube secured with: Tape Dental Injury: Teeth and Oropharynx as per pre-operative assessment

## 2019-07-25 NOTE — Consult Note (Signed)
Reason for Consult:Right little finger lac Referring Physician: [name] is an 54 y.o. male.  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 He had immediate pain and recognized he sustained a fairly extensive laceration. He was brought to the ED where x-rays showed a distal phalanx fx in addition to the laceration and hand surgery was consulted. He is RHD.  History reviewed. No pertinent past medical history.  History reviewed. No pertinent surgical history.  No family history on file.  Social History:  has no history on file for tobacco, alcohol, and drug.  Allergies: No Known Allergies  Medications: I have reviewed the patient's current medications.  No results found for this or any previous visit (from the past 48 hour(s)).  DG Finger Little Right  Result Date: 07/25/2019 CLINICAL DATA:  Crush injury EXAM: RIGHT LITTLE FINGER 2+V COMPARISON:  None. FINDINGS: Comminuted fracture of the tuft of the distal fifth phalanx with mild displacement. Overlying soft tissue injury. 2 mm bony density overlying the PIP joint likely chronic injury. IMPRESSION: Comminuted fracture distal first phalanx with overlying soft tissue injury. Electronically Signed   By: Marlan Palau M.D.   On: 07/25/2019 10:54    Review of Systems  HENT: Negative for ear discharge, ear pain, hearing loss and tinnitus.   Eyes: Negative for photophobia and pain.  Respiratory: Negative for cough and shortness of breath.   Cardiovascular: Negative for chest pain.  Gastrointestinal: Negative for abdominal pain, nausea and vomiting.  Genitourinary: Negative for dysuria, flank pain, frequency and urgency.  Musculoskeletal: Positive for arthralgias (Right little finger). Negative for back pain, myalgias and neck pain.  Neurological: Negative for dizziness and headaches.   Hematological: Does not bruise/bleed easily.  Psychiatric/Behavioral: The patient is not nervous/anxious.    Blood pressure (!) 161/98, pulse 86, temperature 98.1 F (36.7 C), temperature source Oral, resp. rate 18, SpO2 96 %. Physical Exam  Constitutional: He appears well-developed and well-nourished. No distress.  HENT:  Head: Normocephalic and atraumatic.  Eyes: Conjunctivae are normal. Right eye exhibits no discharge. Left eye exhibits no discharge. No scleral icterus.  Cardiovascular: Normal rate and regular rhythm.  Respiratory: Effort normal. No respiratory distress.  Musculoskeletal:     Cervical back: Normal range of motion.     Comments: Right shoulder, elbow, wrist, digits- Complex laceration P2/3 little finger, flex/ext intact at DIP joint, paresthetic radial/ulnar aspects, no instability, no blocks to motion  Sens  Ax/R/M/U intact  Mot   Ax/ R/ PIN/ M/ AIN/ U intact  Rad 2+  Neurological: He is alert.  Skin: Skin is warm and dry. He is not diaphoretic.  Psychiatric: He has a normal mood and affect. His behavior is normal.    Assessment/Plan: Right little finger lac/open distal phalanx fx -- Ancef and tetanus. Will need I&D and repair by Dr. Izora Ribas. Continue NPO. Anticipate discharge after surgery.    Micheal Caldron, PA-C Orthopedic Surgery [phone] 07/25/2019, 12:54 PM

## 2019-07-25 NOTE — Transfer of Care (Signed)
Immediate Anesthesia Transfer of Care Note  Patient: Micheal Nguyen  Procedure(s) Performed: IRRIGATION AND DEBRIDEMENT EXTREMITY (Right )  Patient Location: PACU  Anesthesia Type:General  Level of Consciousness: awake, alert , oriented and patient cooperative  Airway & Oxygen Therapy: Patient Spontanous Breathing and Patient connected to face mask oxygen  Post-op Assessment: Report given to RN, Post -op Vital signs reviewed and stable and Patient moving all extremities X 4  Post vital signs: stable  Last Vitals:  Vitals Value Taken Time  BP 213/114 07/25/19 1951  Temp 36.6 C 07/25/19 1948  Pulse 74 07/25/19 1953  Resp 15 07/25/19 1953  SpO2 100 % 07/25/19 1953  Vitals shown include unvalidated device data.  Last Pain:  Vitals:   07/25/19 1016  TempSrc: Oral  PainSc:          Complications: No apparent anesthesia complications

## 2019-07-25 NOTE — Anesthesia Postprocedure Evaluation (Signed)
Anesthesia Post Note  Patient: Micheal Nguyen  Procedure(s) Performed: IRRIGATION AND DEBRIDEMENT EXTREMITY (Right )     Patient location during evaluation: PACU Anesthesia Type: General Level of consciousness: awake and alert Pain management: pain level controlled Vital Signs Assessment: post-procedure vital signs reviewed and stable Respiratory status: spontaneous breathing, nonlabored ventilation, respiratory function stable and patient connected to nasal cannula oxygen Cardiovascular status: blood pressure returned to baseline and stable Postop Assessment: no apparent nausea or vomiting Anesthetic complications: no    Last Vitals:  Vitals:   07/25/19 1647 07/25/19 1948  BP: (!) 159/89 (!) 202/108  Pulse: 85 82  Resp: 18 13  Temp:  36.6 C  SpO2: 98% 100%    Last Pain:  Vitals:   07/25/19 1016  TempSrc: Oral  PainSc:                  [name]

## 2019-07-25 NOTE — ED Triage Notes (Signed)
Per pt, states he was at work pulling pipe with a co-worker-cut tip of right little finger-bleeding controlled at this time

## 2019-07-25 NOTE — H&P (Signed)
Reason for Consult:finger injury Referring Physician: ER  CC:I cut my finger  HPI:  Micheal Nguyen is an 54 y.o. right handed male who presents with  Near amputation of LSF.  Pt was working with pipe and and a wrench and finger got caught .  Pt wrapped finger and presented to ER. Pain is rated at    8/10 and is described as sharp.  Pain is constant.  Pain is made better by rest/immobilization, worse with motion.   Associated signs/symptoms:denies other injuries Previous treatment:    History reviewed. No pertinent past medical history.  Past Surgical History:  Procedure Laterality Date  . APPENDECTOMY      History reviewed. No pertinent family history.  Social History:  reports that he has been smoking. He has a 60.00 pack-year smoking history. He has never used smokeless tobacco. He reports current alcohol use. No history on file for drug.  Allergies: No Known Allergies  Medications: I have reviewed the patient's current medications.  Results for orders placed or performed during the hospital encounter of 07/25/19 (from the past 48 hour(s))  SARS Coronavirus 2 by RT PCR (hospital order, performed in [facility] hospital lab) Nasopharyngeal Nasopharyngeal Swab     Status: None   Collection Time: 07/25/19 12:17 PM   Specimen: Nasopharyngeal Swab  Result Value Ref Range   SARS Coronavirus 2 NEGATIVE NEGATIVE    Comment: (NOTE) SARS-CoV-2 target nucleic acids are NOT DETECTED. The SARS-CoV-2 RNA is generally detectable in upper and lower respiratory specimens during the acute phase of infection. The lowest concentration of SARS-CoV-2 viral copies this assay can detect is 250 copies / mL. A negative result does not preclude SARS-CoV-2 infection and should not be used as the sole basis for treatment or other patient management decisions.  A negative result may occur with improper specimen collection / handling, submission of specimen other than nasopharyngeal swab, presence of viral  mutation(s) within the areas targeted by this assay, and inadequate number of viral copies (<250 copies / mL). A negative result must be combined with clinical observations, patient history, and epidemiological information. Fact Sheet for Patients:   StrictlyIdeas.no Fact Sheet for Healthcare Providers: BankingDealers.co.za This test is not yet approved or cleared  by the [place] FDA and has been authorized for detection and/or diagnosis of SARS-CoV-2 by FDA under an Emergency Use Authorization (EUA).  This EUA will remain in effect (meaning this test can be used) for the duration of the COVID-19 declaration under Section 564(b)(1) of the Act, 21 U.S.C. section 360bbb-3(b)(1), unless the authorization is terminated or revoked sooner. Performed at [facility] [place] [address], [place], [place] [address]     DG Finger Little Right  Result Date: 07/25/2019 CLINICAL DATA:  Crush injury EXAM: RIGHT LITTLE FINGER 2+V COMPARISON:  None. FINDINGS: Comminuted fracture of the tuft of the distal fifth phalanx with mild displacement. Overlying soft tissue injury. 2 mm bony density overlying the PIP joint likely chronic injury. IMPRESSION: Comminuted fracture distal first phalanx with overlying soft tissue injury. Electronically Signed   By: Franchot Gallo M.D.   On: 07/25/2019 10:54    Pertinent items are noted in HPI. Temp:  [98.1 F (36.7 C)] 98.1 F (36.7 C) (05/17 1016) Pulse Rate:  [85-86] 85 (05/17 1647) Resp:  [18] 18 (05/17 1647) BP: (159-161)/(89-98) 159/89 (05/17 1647) SpO2:  [96 %-98 %] 98 % (05/17 1647) Weight:  [67.8 kg] 67.8 kg (05/17 1737) General appearance: alert and cooperative Resp: clear to  auscultation bilaterally Cardio: regular rate and rhythm GI: soft, non-tender; bowel sounds normal; no masses,  no organomegaly Extremities: extremities normal, atraumatic, no cyanosis or edema Except for LSF with  complex open fracture, laceration, nail bed injury. Probable digital nerve injury  Assessment: Complex laceration/open fracture Plan: Will explore wound, repair structures as needed I have discussed this treatment plan in detail with patient , including the risks of the recommended treatment and surgery, the benefits and the alternatives.  The patient understands that additional treatment may be necessary.  Johnette Abraham 07/25/2019, 6:31 PM

## 2019-07-25 NOTE — ED Provider Notes (Signed)
Accepted handoff at shift change from [place] Robinson PA-C. Please see prior provider note for more detail.   Briefly: Patient is 54 y.o. with a right tuft fracture of his distal phalanx of right pinky finger.  Plan: Dr. Izora Ribas at hand surgery will evaluate patient at either 6:30 PM this evening in the operating room or potentially at bedside at 4 PM.  Plan is to manage pain in the interim.  Patient Covid test has returned and is negative.  We will continue to monitor patient.     Physical Exam  BP (!) 161/98 (BP Location: Left Arm)   Pulse 86   Temp 98.1 F (36.7 C) (Oral)   Resp 18   SpO2 96%   CONSTITUTIONAL:  well-appearing, NAD NEURO:  Alert and oriented x 3, no focal deficits EYES:  pupils equal and reactive ENT/NECK:  trachea midline, no JVD CARDIO:  reg rate, reg rhythm, well-perfused PULM:  None labored breathing GI/GU:  Abdomen non-distended MSK/SPINE:  No gross deformities, no edema. Right pinkie finger with distal phalanx with deformity and near amputation of the distal finger.  The nail matrix is exposed as well though the finger nail is in hold.  Wound is not actively bleeding.  There is decreased sensation to the distal ulnar aspect and normal sensation to the radial aspect of the finger tip.  The fingertip appears dusky in color.  SKIN:  no rash obvious, atraumatic, no ecchymosis  PSYCH:  Appropriate speech and behavior  ED Course/Procedures     Procedures  Results for orders placed or performed during the hospital encounter of 07/25/19  SARS Coronavirus 2 by RT PCR (hospital order, performed in [facility] hospital lab) Nasopharyngeal Nasopharyngeal Swab   Specimen: Nasopharyngeal Swab  Result Value Ref Range   SARS Coronavirus 2 NEGATIVE NEGATIVE   DG Finger Little Right  Result Date: 07/25/2019 CLINICAL DATA:  Crush injury EXAM: RIGHT LITTLE FINGER 2+V COMPARISON:  None. FINDINGS: Comminuted fracture of the tuft of the distal fifth phalanx with mild  displacement. Overlying soft tissue injury. 2 mm bony density overlying the PIP joint likely chronic injury. IMPRESSION: Comminuted fracture distal first phalanx with overlying soft tissue injury. Electronically Signed   By: Marlan Palau M.D.   On: 07/25/2019 10:54     MDM   Patient is well-appearing time of reevaluation.  We will continue to monitor for signs of pain.  Will dose with pain medications or redo digital block.  5:23 PM patient's pain reevaluated this time.  He states that his pain is manageable.  On my reevaluation he was picked up by the OR staff to take to surgery.  Patient is no longer in emergency department care.      Solon Augusta [place], [place] 07/25/19 1724    Milagros Loll, MD 07/27/19 1250

## 2019-07-25 NOTE — Discharge Instructions (Signed)
Discharge Instructions:  Keep your dressing clean, dry and in place until instructed to remove by Dr. Brennden Masten.  If the dressing becomes dirty or wet call the office for instructions during business hours. Elevate the extremity to help with swelling, this will also help with any discomfort. Take your medication as prescribed. No lifting with the injured  extremity. If you feel that the dressing is too tight, you may loosen it, but keep it on; finger tips should be pink; if there is a concern, call the office. [phone] Ice may be used if the injury is a fracture, do not apply ice directly to the skin. Please call the office on the next business day after discharge to arrange a follow up appointment.  Call [phone] between the hours of 9am - 5pm M-Th or 9am - 1pm on Fri. For most hand injuries and/or conditions, you may return to work using the uninjured hand (one handed duty) within 24-72 hours.  A detailed note will be provided to you at your follow up appointment or may contact the office prior to your follow up.    

## 2019-07-25 NOTE — Anesthesia Preprocedure Evaluation (Addendum)
Anesthesia Evaluation  Patient identified by MRN, date of birth, ID band Patient awake    Reviewed: Allergy & Precautions, NPO status , Patient's Chart, lab work & pertinent test results  Airway Mallampati: II  TM Distance: >3 FB Neck ROM: Full    Dental no notable dental hx. (+) Teeth Intact, Dental Advisory Given   Pulmonary Current Smoker,    Pulmonary exam normal breath sounds clear to auscultation       Cardiovascular Exercise Tolerance: Good Normal cardiovascular exam Rhythm:Regular Rate:Normal     Neuro/Psych negative neurological ROS  negative psych ROS   GI/Hepatic negative GI ROS, Neg liver ROS,   Endo/Other  negative endocrine ROS  Renal/GU negative Renal ROS     Musculoskeletal   Abdominal   Peds  Hematology negative hematology ROS (+)   Anesthesia Other Findings   Reproductive/Obstetrics                            Anesthesia Physical Anesthesia Plan  ASA: II and emergent  Anesthesia Plan: General   Post-op Pain Management:    Induction: Intravenous  PONV Risk Score and Plan: 3 and Ondansetron and Dexamethasone  Airway Management Planned: LMA  Additional Equipment: None  Intra-op Plan:   Post-operative Plan:   Informed Consent: I have reviewed the patients History and Physical, chart, labs and discussed the procedure including the risks, benefits and alternatives for the proposed anesthesia with the patient or authorized representative who has indicated his/her understanding and acceptance.     Dental advisory given  Plan Discussed with:   Anesthesia Plan Comments:        Anesthesia Quick Evaluation

## 2019-07-26 NOTE — Op Note (Signed)
NAMEMARQUIS, DOWN MEDICAL RECORD JO:[id number] ACCOUNT [id number] DATE OF BIRTH:28-Nov-1965 FACILITY: [facility] LOCATION: [facility] PHYSICIAN:Darek Eifler C. Demitrious Mccannon, MD  OPERATIVE REPORT  DATE OF PROCEDURE:  07/25/2019  PREOPERATIVE DIAGNOSIS:  Complex laceration near amputation to the left small finger.  POSTOPERATIVE DIAGNOSIS:  Complex laceration near amputation to the left small finger.  PROCEDURE: 1.  Exploration of complex wound and open fracture of left small finger. 2.  Full thickness debridement of skin, subcutaneous tissue, nail bed and bone with scissors, rongeur and knife. 3.  Thorough irrigation of the laceration. 4.  Removal of nail plate. 5.  Repair of nail bed. 6.  Open reduction internal fixation of the distal phalanx with a 0.045 K-wire. 7.  Repair of complex laceration totaling 3 cm.  INDICATIONS:  The patient is a 54 year old gentleman that was working with a pipe and wrench this afternoon and injured his finger.  He presented to the ER with a near amputation to the distal portion of his left small finger.  He was evaluated in the  ED.  I was consulted.  On evaluation, it was evident he had an open fracture and nail bed injury and complex laceration.  The fingertip was viable.  Risks, benefits and alternatives of surgery were discussed with him.  He agreed with this course of  action.  Consent was obtained.  DESCRIPTION OF PROCEDURE:  The patient was taken to the operating room and placed supine on the operating table.  Anesthesia was administered without difficulty.  The left upper extremity was prepped and draped in normal sterile fashion.  A tourniquet  was used on the upper arm.  The arm was exsanguinated and the tourniquet was elevated to 250 mmHg.  The finger wound was then explored.  There was a large gaping wound on the ulnar aspect of the finger.  The fingertip was held on by just the radial side.   The nail plate was lifted from the nail bed.  There was a complex laceration  to the nail bed.  There was distal phalanx exposed and jagged skin edges around to the volar aspect of the finger.  This wound was thoroughly irrigated with irrigation  solution.  Full thickness skin, subcutaneous tissue, bone and nail bed were debrided with a rongeur and knife and scissors.  Afterwards, the fracture was reduced.  A 0.045 K-wire was placed across the fracture site holding the distal phalanx in near  anatomic position.  The nail bed was approximated with interrupted 5-0 plain gut sutures.  There was some fraying of this tissue; however, repair was performed as best as possible.  Next, the skin laceration was closed with multiple interrupted 4-0  Prolene sutures.  Afterwards, the tourniquet was released.  The fingertip was pink in color.  A sterile dressing was applied.  ESTIMATED BLOOD LOSS:  Minimal.  COMPLICATIONS:  No acute complications.  CN/NUANCE  D:07/25/2019 T:07/26/2019 JOB:[id number]

## 2021-01-22 IMAGING — CR DG FINGER LITTLE 2+V*R*
3 series · 3 of 3 positions shown · non-contrast
Comparison: None.

CLINICAL DATA: Crush injury

EXAM:
RIGHT LITTLE FINGER 2+V

[x finger pa right]
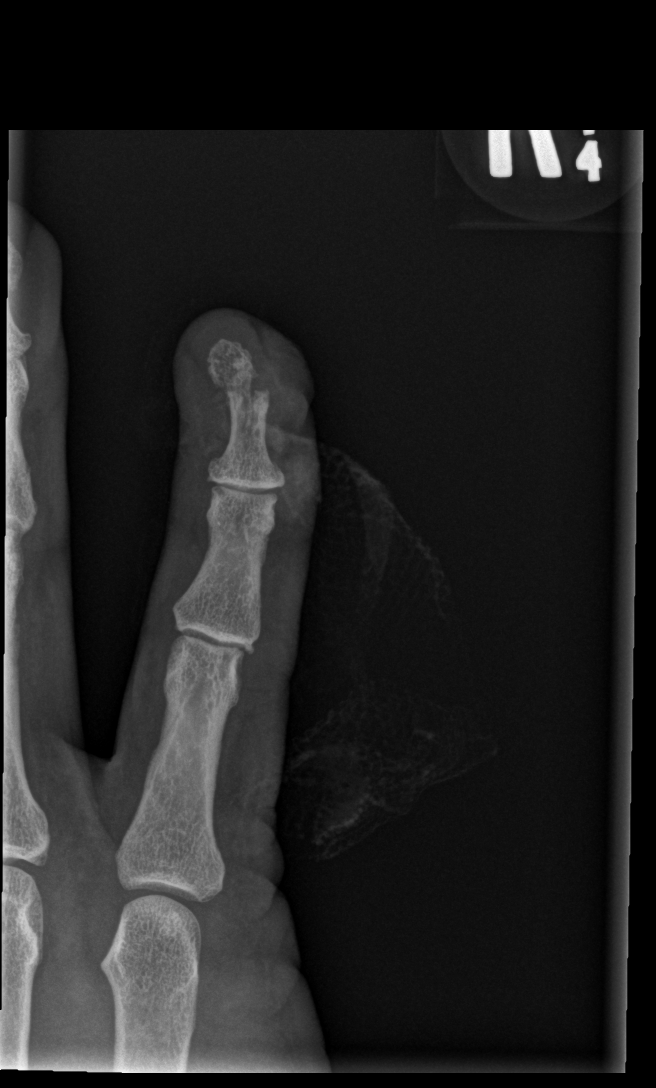

[x finger obl right]
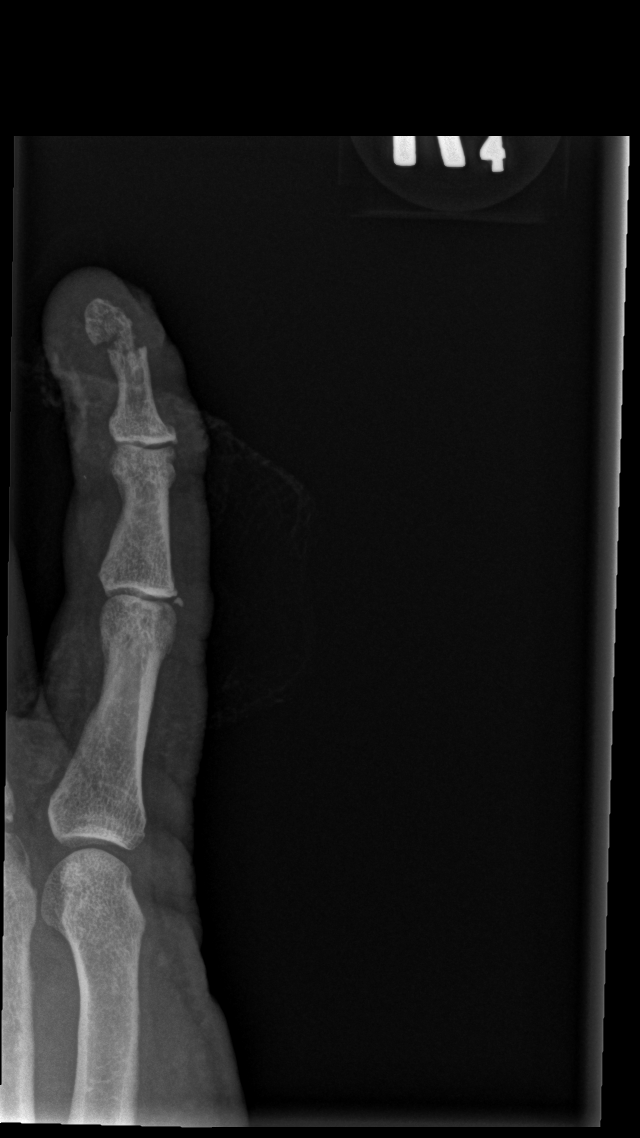

[x finger lat right]
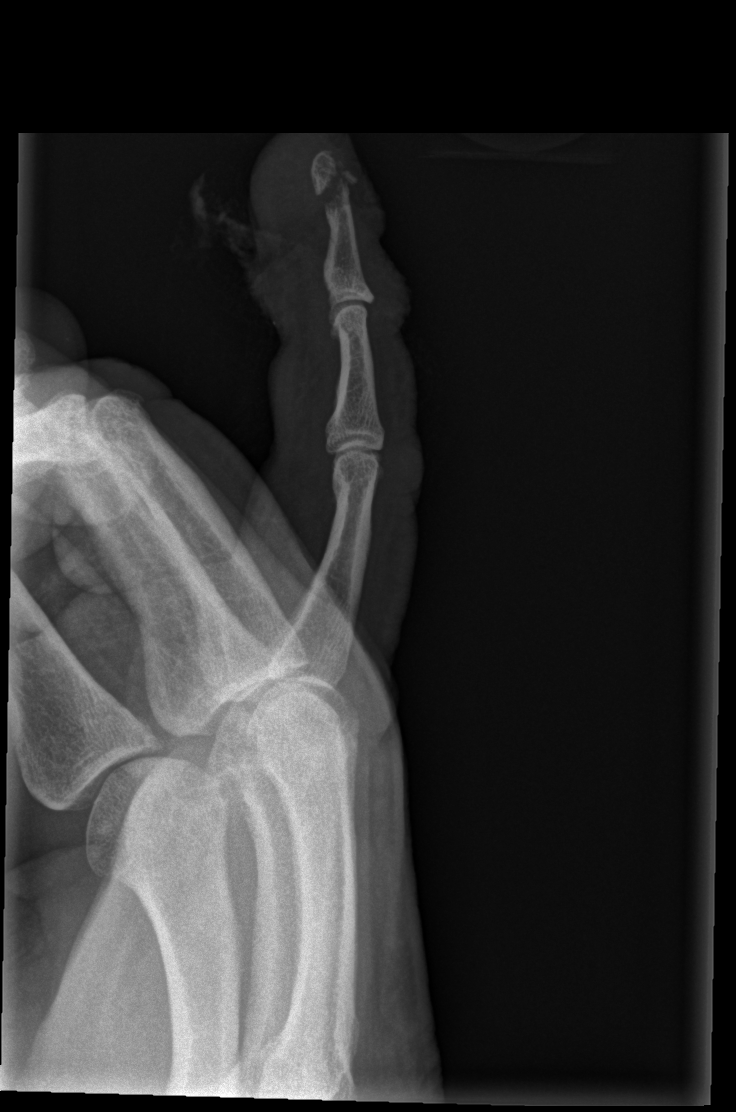

[3 of 3 positions shown; findings below may reference images not displayed]

FINDINGS: Comminuted fracture of the tuft of the distal fifth phalanx with
mild displacement. Overlying soft tissue injury.

2 mm bony density overlying the PIP joint likely chronic injury.
IMPRESSION: Comminuted fracture distal first phalanx with overlying soft tissue
injury.
